# Patient Record
Sex: Female | Born: 1965 | Race: White | Hispanic: No | Smoking: Current every day smoker
Health system: Southern US, Community
[De-identification: ages and names within clinical notes are randomized; demographics above are authoritative.]

## PROBLEM LIST (undated history)

## (undated) DIAGNOSIS — F319 Bipolar disorder, unspecified: Secondary | ICD-10-CM

## (undated) DIAGNOSIS — F4310 Post-traumatic stress disorder, unspecified: Secondary | ICD-10-CM

## (undated) HISTORY — PX: TUBAL LIGATION: SHX77

## (undated) HISTORY — DX: Post-traumatic stress disorder, unspecified: F43.10

---
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
## 2009-10-05 ENCOUNTER — Emergency Department (HOSPITAL_COMMUNITY): Admission: EM | Admit: 2009-10-05 | Discharge: 2009-10-05 | Payer: Self-pay | Admitting: Emergency Medicine

## 2013-04-13 ENCOUNTER — Encounter (HOSPITAL_COMMUNITY): Payer: Self-pay | Admitting: Emergency Medicine

## 2013-04-13 ENCOUNTER — Emergency Department (INDEPENDENT_AMBULATORY_CARE_PROVIDER_SITE_OTHER)
Admission: EM | Admit: 2013-04-13 | Discharge: 2013-04-13 | Disposition: A | Discharge status: Home / Self Care | Payer: Self-pay | Source: Home / Self Care | Attending: Emergency Medicine | Admitting: Emergency Medicine

## 2013-04-13 DIAGNOSIS — H00019 Hordeolum externum unspecified eye, unspecified eyelid: Secondary | ICD-10-CM

## 2013-04-13 DIAGNOSIS — H00016 Hordeolum externum left eye, unspecified eyelid: Secondary | ICD-10-CM

## 2013-04-13 HISTORY — DX: Bipolar disorder, unspecified: F31.9

## 2013-04-13 MED ORDER — SULFAMETHOXAZOLE-TMP DS 800-160 MG PO TABS: 2.0000 | ORAL_TABLET | Freq: Two times a day (BID) | ORAL | Status: DC

## 2013-04-13 MED ORDER — CEPHALEXIN 500 MG PO CAPS: 500.0000 mg | ORAL_CAPSULE | Freq: Three times a day (TID) | ORAL | Status: DC

## 2013-04-13 MED ORDER — ERYTHROMYCIN 5 MG/GM OP OINT: TOPICAL_OINTMENT | OPHTHALMIC | Status: DC

## 2013-04-13 MED ORDER — TETRACAINE HCL 0.5 % OP SOLN
OPHTHALMIC | Status: AC
  Filled 2013-04-13: qty 2

## 2013-04-13 NOTE — ED Notes (Signed)
Left eye pain, symptoms onset Friday.  Reports bump in corner of eye.  Reports using warm and cold compresses.  Used family member vigamox x 2 and bacitracin opthalmic ointment.  Saturday reported popping bump and " core came out" a second bump developed and too painful to touch.  Has continued to squeeze pus out of first bump.

## 2013-04-13 NOTE — ED Provider Notes (Signed)
Chief Complaint:   Chief Complaint  Patient presents with  . Eye Pain    History of Present Illness:   Tammy Pierce is a 47 year old female who has had a six-day history of swelling and erythema on her left lower eyelid margin. Her aunt popped a couple of the small pimple-like lesions that had come up and drained some pus out. She's been treating this herself with some Vigamox and bacitracin ointment without improvement. It seems to be getting worse. It's not painful right now and hurts minimally to touch. She denies any redness of the eye, purulent drainage, or changes in her vision. She has no photophobia. No pain on eye movement. No swelling of the periorbital tissues. The right eye is normal. She's never had anything like this before.   Review of Systems:  Other than noted above, the patient denies any of the following symptoms: Systemic:  No fever, chills, sweats, fatigue, or weight loss. Eye:  No redness, eye pain, photophobia, discharge, blurred vision, or diplopia. ENT:  No nasal congestion, rhinorrhea, or sore throat. Lymphatic:  No adenopathy. Skin:  No rash or pruritis.  PMFSH:  Past medical history, family history, social history, meds, and allergies were reviewed.  She has no medication allergies. She takes Tegretol and trazodone for bipolar disorder. She is a cigarette smoker.  Physical Exam:   Vital signs:  BP 142/86  Pulse 78  Temp(Src) 97 F (36.1 C) (Oral)  Resp 16  SpO2 96% General:  Alert and in no distress. Eye:  She has 3 small pustules on her left lower eyelid margin. There is slight surrounding erythema but this does not extend to the periorbital tissues. The upper lid is normal. Conjunctival sac is normal without any discharge. There is no erythema of the conjunctiva or sclera. Anterior chambers normal, cornea is intact, fundi benign, PERRLA, full EOMs without any pain. ENT:  TMs and canals clear.  Nasal mucosa normal.  No intra-oral lesions, mucous membranes moist,  pharynx clear. Neck:  No adenopathy tenderness or mass. Skin:  Clear, warm and dry.  Assessment:  The encounter diagnosis was Hordeolum externum, left.  Plan:   1.  The following meds were prescribed:   Discharge Medication List as of 04/13/2013 10:42 AM    START taking these medications   Details  cephALEXin (KEFLEX) 500 MG capsule Take 1 capsule (500 mg total) by mouth 3 (three) times daily., Starting 04/13/2013, Until Discontinued, Normal    erythromycin ophthalmic ointment Place a 1/2 inch ribbon of ointment into the lower eyelid., Normal    sulfamethoxazole-trimethoprim (BACTRIM DS) 800-160 MG per tablet Take 2 tablets by mouth 2 (two) times daily., Starting 04/13/2013, Until Discontinued, Normal       2.  The patient was instructed in symptomatic care and handouts were given. 3.  The patient was told to return if becoming worse in any way, if no better in 3 or 4 days, and given some red flag symptoms such as increasing swelling or pain or changes in her vision that would indicate earlier return. She was given the name of Dr. Wayna Chalet to followup with if that should get worse or fail to improve.     Reuben Likes, MD 04/13/13 [phone]

## 2013-07-08 DIAGNOSIS — E7800 Pure hypercholesterolemia, unspecified: Secondary | ICD-10-CM | POA: Insufficient documentation

## 2015-08-27 ENCOUNTER — Emergency Department (HOSPITAL_COMMUNITY)
Admission: EM | Admit: 2015-08-27 | Discharge: 2015-08-28 | Disposition: A | Discharge status: Home / Self Care | Payer: Self-pay | Attending: Emergency Medicine | Admitting: Emergency Medicine

## 2015-08-27 ENCOUNTER — Encounter (HOSPITAL_COMMUNITY): Payer: Self-pay | Admitting: Emergency Medicine

## 2015-08-27 DIAGNOSIS — Z720 Tobacco use: Secondary | ICD-10-CM | POA: Insufficient documentation

## 2015-08-27 DIAGNOSIS — R200 Anesthesia of skin: Secondary | ICD-10-CM | POA: Insufficient documentation

## 2015-08-27 DIAGNOSIS — R2243 Localized swelling, mass and lump, lower limb, bilateral: Secondary | ICD-10-CM | POA: Insufficient documentation

## 2015-08-27 DIAGNOSIS — R202 Paresthesia of skin: Secondary | ICD-10-CM | POA: Insufficient documentation

## 2015-08-27 DIAGNOSIS — Z792 Long term (current) use of antibiotics: Secondary | ICD-10-CM | POA: Insufficient documentation

## 2015-08-27 DIAGNOSIS — F319 Bipolar disorder, unspecified: Secondary | ICD-10-CM | POA: Insufficient documentation

## 2015-08-27 DIAGNOSIS — Z79899 Other long term (current) drug therapy: Secondary | ICD-10-CM | POA: Insufficient documentation

## 2015-08-27 NOTE — ED Notes (Signed)
Pt. reports bilateral lower legs and feet  swelling onset this week , denies injury , pt. stated she started a new job that requires standing for several hours , pt. added right hand numbness onset this evening . Equal grips with no arm drift.

## 2015-08-28 ENCOUNTER — Emergency Department (HOSPITAL_COMMUNITY): Payer: Self-pay

## 2015-08-28 DIAGNOSIS — R202 Paresthesia of skin: Secondary | ICD-10-CM

## 2015-08-28 LAB — CBC
HEMATOCRIT: 40.5 % (ref 36.0–46.0)
HEMOGLOBIN: 13.5 g/dL (ref 12.0–15.0)
MCH: 31.9 pg (ref 26.0–34.0)
MCHC: 33.3 g/dL (ref 30.0–36.0)
MCV: 95.7 fL (ref 78.0–100.0)
Platelets: 164 10*3/uL (ref 150–400)
RBC: 4.23 MIL/uL (ref 3.87–5.11)
RDW: 14 % (ref 11.5–15.5)
WBC: 6.4 10*3/uL (ref 4.0–10.5)

## 2015-08-28 LAB — PROTIME-INR
INR: 0.93 (ref 0.00–1.49)
Prothrombin Time: 12.7 seconds (ref 11.6–15.2)

## 2015-08-28 LAB — DIFFERENTIAL
BASOS ABS: 0 10*3/uL (ref 0.0–0.1)
Basophils Relative: 0 % (ref 0–1)
Eosinophils Absolute: 0.2 10*3/uL (ref 0.0–0.7)
Eosinophils Relative: 3 % (ref 0–5)
LYMPHS ABS: 3.2 10*3/uL (ref 0.7–4.0)
LYMPHS PCT: 49 % — AB (ref 12–46)
MONOS PCT: 10 % (ref 3–12)
Monocytes Absolute: 0.6 10*3/uL (ref 0.1–1.0)
NEUTROS ABS: 2.4 10*3/uL (ref 1.7–7.7)
Neutrophils Relative %: 38 % — ABNORMAL LOW (ref 43–77)

## 2015-08-28 LAB — RAPID URINE DRUG SCREEN, HOSP PERFORMED
Amphetamines: NOT DETECTED
BARBITURATES: NOT DETECTED
BENZODIAZEPINES: NOT DETECTED
Cocaine: NOT DETECTED
Opiates: NOT DETECTED
Tetrahydrocannabinol: NOT DETECTED

## 2015-08-28 LAB — APTT: APTT: 24 s (ref 24–37)

## 2015-08-28 LAB — I-STAT TROPONIN, ED: TROPONIN I, POC: 0 ng/mL (ref 0.00–0.08)

## 2015-08-28 LAB — URINALYSIS, ROUTINE W REFLEX MICROSCOPIC
Bilirubin Urine: NEGATIVE
GLUCOSE, UA: NEGATIVE mg/dL
HGB URINE DIPSTICK: NEGATIVE
KETONES UR: NEGATIVE mg/dL
LEUKOCYTES UA: NEGATIVE
Nitrite: NEGATIVE
PROTEIN: NEGATIVE mg/dL
Specific Gravity, Urine: 1.022 (ref 1.005–1.030)
Urobilinogen, UA: 0.2 mg/dL (ref 0.0–1.0)
pH: 7 (ref 5.0–8.0)

## 2015-08-28 LAB — I-STAT CHEM 8, ED
BUN: 10 mg/dL (ref 6–20)
CALCIUM ION: 1.16 mmol/L (ref 1.12–1.23)
CHLORIDE: 108 mmol/L (ref 101–111)
CREATININE: 0.9 mg/dL (ref 0.44–1.00)
GLUCOSE: 108 mg/dL — AB (ref 65–99)
HCT: 42 % (ref 36.0–46.0)
Hemoglobin: 14.3 g/dL (ref 12.0–15.0)
POTASSIUM: 5.3 mmol/L — AB (ref 3.5–5.1)
Sodium: 141 mmol/L (ref 135–145)
TCO2: 23 mmol/L (ref 0–100)

## 2015-08-28 LAB — COMPREHENSIVE METABOLIC PANEL
ALK PHOS: 74 U/L (ref 38–126)
ALT: 53 U/L (ref 14–54)
AST: 43 U/L — AB (ref 15–41)
Albumin: 3.7 g/dL (ref 3.5–5.0)
Anion gap: 8 (ref 5–15)
BILIRUBIN TOTAL: 0.7 mg/dL (ref 0.3–1.2)
BUN: 8 mg/dL (ref 6–20)
CALCIUM: 8.7 mg/dL — AB (ref 8.9–10.3)
CHLORIDE: 109 mmol/L (ref 101–111)
CO2: 22 mmol/L (ref 22–32)
CREATININE: 0.88 mg/dL (ref 0.44–1.00)
GFR calc Af Amer: >60 mL/min (ref >60)
Glucose, Bld: 111 mg/dL — ABNORMAL HIGH (ref 65–99)
Potassium: 4.2 mmol/L (ref 3.5–5.1)
Sodium: 139 mmol/L (ref 135–145)
TOTAL PROTEIN: 5.7 g/dL — AB (ref 6.5–8.1)

## 2015-08-28 LAB — ETHANOL: Alcohol, Ethyl (B): <5 mg/dL (ref <5)

## 2015-08-28 LAB — CK: CK TOTAL: 107 U/L (ref 38–234)

## 2015-08-28 NOTE — ED Notes (Signed)
Pt ambulated to restroom with this RN.   

## 2015-08-28 NOTE — ED Notes (Signed)
Patient transported to CT 

## 2015-08-28 NOTE — Consult Note (Signed)
Consult Reason for Consult:right arm paresthesias Referring Physician: Dr Mora Bellman [facility]  CC: RUE paresthesias  HPI: Tammy Pierce is an 49 y.o. female with history of bipolar affective disorder presenting with transient RUE paresthesias. Notes watching TV when she developed tingling in her right hand that radiated up to her shoulder. No clear dermatomal distribution. Symptoms slowly resolved and was left with pins and needles sensation in her right palm. This completely resolved after 45 minutes. Denies any weakness. Does note a cramping sensation in her right biceps. No LUE or LE symptoms. No speech or visual deficits. Currently asymptomatic besides for right bicep tightness. No prior TIA or CVA. No HTN, DM or HLD. Does not take a daily antiplatelet.   Of note she started a new job last week which involves a lot of standing and using her arms excessively.   Past Medical History  Diagnosis Date  . Bipolar affective     Past Surgical History  Procedure Laterality Date  . Tubal ligation      No family history on file.  Social History:  reports that she has been smoking.  She does not have any smokeless tobacco history on file. She reports that she drinks alcohol. She reports that she does not use illicit drugs.  No Known Allergies  Medications: I have reviewed the patient's current medications.  ROS: Out of a complete 14 system review, the patient complains of only the following symptoms, and all other reviewed systems are negative. +paresthesias  Physical Examination: Filed Vitals:   08/28/15 0100  BP: 148/81  Pulse: 78  Temp:   Resp: 19   Physical Exam  Constitutional: He appears well-developed and well-nourished.  Psych: Affect appropriate to situation Eyes: No scleral injection HENT: No OP obstrucion Head: Normocephalic.  Cardiovascular: Normal rate and regular rhythm.  Respiratory: Effort normal and breath sounds normal.  GI: Soft. Bowel sounds are normal. No  distension. There is no tenderness.  Skin: WDI  Neurologic Examination Mental Status: Alert, oriented, thought content appropriate.  Speech fluent without evidence of aphasia.  Able to follow 3 step commands without difficulty. Cranial Nerves: II: funduscopic exam wnl bilaterally, visual fields grossly normal, pupils equal, round, reactive to light and accommodation III,IV, VI: ptosis not present, extra-ocular motions intact bilaterally V,VII: smile symmetric, facial light touch sensation normal bilaterally VIII: hearing normal bilaterally IX,X: gag reflex present XI: trapezius strength/neck flexion strength normal bilaterally XII: tongue strength normal  Motor: Right : Upper extremity    Left:     Upper extremity 5/5 deltoid       5/5 deltoid 5/5 biceps      5/5 biceps  5/5 triceps      5/5 triceps 5/5 hand grip      5/5 hand grip  Lower extremity     Lower extremity 5/5 hip flexor      5/5 hip flexor 5/5 quadricep      5/5 quadriceps  5/5 hamstrings     5/5 hamstrings 5/5 plantar flexion       5/5 plantar flexion 5/5 plantar extension     5/5 plantar extension Tone and bulk:normal tone throughout; no atrophy noted Sensory: Pinprick and light touch intact throughout, bilaterally Deep Tendon Reflexes: 2+ and symmetric throughout Plantars: Right: downgoing   Left: downgoing Cerebellar: normal finger-to-nose, normal rapid alternating movements and normal heel-to-shin test Gait: deferred  Laboratory Studies:   Basic Metabolic Panel:  Recent Labs Lab 08/28/15 0113  NA 141  K 5.3*  CL 108  GLUCOSE 108*  BUN 10  CREATININE 0.90    Liver Function Tests: No results for input(s): AST, ALT, ALKPHOS, BILITOT, PROT, ALBUMIN in the last 168 hours. No results for input(s): LIPASE, AMYLASE in the last 168 hours. No results for input(s): AMMONIA in the last 168 hours.  CBC:  Recent Labs Lab 08/28/15 0059 08/28/15 0113  WBC 6.4  --   NEUTROABS 2.4  --   HGB 13.5 14.3   HCT 40.5 42.0  MCV 95.7  --   PLT 164  --     Cardiac Enzymes: No results for input(s): CKTOTAL, CKMB, CKMBINDEX, TROPONINI in the last 168 hours.  BNP: Invalid input(s): POCBNP  CBG: No results for input(s): GLUCAP in the last 168 hours.  Microbiology: No results found for this or any previous visit.  Coagulation Studies: No results for input(s): LABPROT, INR in the last 72 hours.  Urinalysis: No results for input(s): COLORURINE, LABSPEC, PHURINE, GLUCOSEU, HGBUR, BILIRUBINUR, KETONESUR, PROTEINUR, UROBILINOGEN, NITRITE, LEUKOCYTESUR in the last 168 hours.  Invalid input(s): APPERANCEUR  Lipid Panel:  No results found for: CHOL, TRIG, HDL, CHOLHDL, VLDL, LDLCALC  HgbA1C: No results found for: HGBA1C  Urine Drug Screen:  No results found for: LABOPIA, COCAINSCRNUR, LABBENZ, AMPHETMU, THCU, LABBARB  Alcohol Level: No results for input(s): ETH in the last 168 hours.  Other results:  Imaging: No results found.   Assessment/Plan:  48y/o hx of bipolar presenting with transient paresthesias in her RUE radiating from shoulder down to hand. Currently asymptomatic with exception of mild cramping in right bicep. No stroke risk factors. Feel her presentation is most consistent with a possible cervical radiculopathy, especially in light of recently starting a new job with excessive use of her arms. Cannot rule out TIA though low risk based on history and lack of risk factors.  -CT head. If negative no further inpatient workup required at this time -suggest taking ASA  daily -outpatient follow up with PCP -immediately return to ED if symptoms return  Elspeth Cho, DO [facility] [phone]  If 7pm- 7am, please page neurology on call as listed in AMION. 08/28/2015, 1:19 AM

## 2015-08-28 NOTE — Discharge Instructions (Signed)
Paresthesia Tammy Pierce, your workup today was normal.  See neurology within 3 days for close follow up.  If symptoms worsen, come back to the ED immediately.  Thank you. Paresthesia is a burning or prickling feeling. This feeling can happen in any part of the body. It often happens in the hands, arms, legs, or feet. HOME CARE  Avoid drinking alcohol.  Try massage or needle therapy (acupuncture) to help with your problems.  Keep all doctor visits as told. GET HELP RIGHT AWAY IF:   You feel weak.  You have trouble walking or moving.  You have problems speaking or seeing.  You feel confused.  You cannot control when you poop (bowel movement) or pee (urinate).  You lose feeling (numbness) after an injury.  You pass out (faint).  Your burning or prickling feeling gets worse when you walk.  You have pain, cramps, or feel dizzy.  You have a rash. MAKE SURE YOU:   Understand these instructions.  Will watch your condition.  Will get help right away if you are not doing well or get worse. Document Released: 11/29/2008 Document Revised: 03/10/2012 Document Reviewed: 09/07/2011 [facility] Patient Information 2015 [place], [place]. This information is not intended to replace advice given to you by your health care provider. Make sure you discuss any questions you have with your health care provider.

## 2015-08-28 NOTE — ED Provider Notes (Signed)
CSN: [id number]     Arrival date & time 08/27/15  2337 History  This chart was scribed for Tomasita Crumble, MD by Freida Busman, ED Scribe. This patient was seen in room B19C/B19C and the patient's care was started 12:26 AM.    Chief Complaint  Patient presents with  . Leg Pain  . Numbness    The history is provided by the patient. No language interpreter was used.     HPI Comments:  Tammy Pierce is a 49 y.o. female who presents to the Emergency Department complaining of  numbness and tightness in her right upper arm and  and right hand that started ~ 45 min ago. She reports associated tingling to her right palm, a knot behind her left calf and mild swelling to her BLE. She denies weakness in her BUE/BLE. Pt notes she recently started a new job where she is on her feet for long periods of time. She has used a heating pad for the knot in her LLE with mild relief. She denies weakness, speech changes, confusion, SOB and CP. Pt has a family history of CVA and TIA.  Past Medical History  Diagnosis Date  . Bipolar affective    Past Surgical History  Procedure Laterality Date  . Tubal ligation     No family history on file. Social History  Substance Use Topics  . Smoking status: Current Every Day Smoker  . Smokeless tobacco: None  . Alcohol Use: Yes   OB History    No data available     Review of Systems  A complete 10 system review of systems was obtained and all systems are negative except as noted in the HPI and PMH.    Allergies  Review of patient's allergies indicates no known allergies.  Home Medications   Prior to Admission medications   Medication Sig Start Date End Date Taking? Authorizing Provider  carbamazepine (CARBATROL) 100 MG 12 hr capsule Take 100 mg by mouth 2 (two) times daily.    Historical Provider, MD  cephALEXin (KEFLEX) 500 MG capsule Take 1 capsule (500 mg total) by mouth 3 (three) times daily. 04/13/13   Reuben Likes, MD  erythromycin ophthalmic  ointment Place a 1/2 inch ribbon of ointment into the lower eyelid. 04/13/13   Reuben Likes, MD  sulfamethoxazole-trimethoprim (BACTRIM DS) 800-160 MG per tablet Take 2 tablets by mouth 2 (two) times daily. 04/13/13   Reuben Likes, MD  TRAZODONE HCL PO Take by mouth.    Historical Provider, MD   BP 152/84 mmHg  Pulse 84  Temp(Src) 98.7 F (37.1 C) (Oral)  Resp 20  SpO2 98% Physical Exam  Constitutional: She is oriented to person, place, and time. She appears well-developed and well-nourished. No distress.  HENT:  Head: Normocephalic and atraumatic.  Nose: Nose normal.  Mouth/Throat: Oropharynx is clear and moist. No oropharyngeal exudate.  Eyes: Conjunctivae and EOM are normal. Pupils are equal, round, and reactive to light. No scleral icterus.  Neck: Normal range of motion. Neck supple. No JVD present. No tracheal deviation present. No thyromegaly present.  Cardiovascular: Normal rate, regular rhythm and normal heart sounds.  Exam reveals no gallop and no friction rub.   No murmur heard. Pulmonary/Chest: Effort normal and breath sounds normal. No respiratory distress. She has no wheezes. She exhibits no tenderness.  Abdominal: Soft. Bowel sounds are normal. She exhibits no distension and no mass. There is no tenderness. There is no rebound and no guarding.  Musculoskeletal: Normal  range of motion. She exhibits edema (BLE, equal). She exhibits no tenderness.  Lymphadenopathy:    She has no cervical adenopathy.  Neurological: She is alert and oriented to person, place, and time. No cranial nerve deficit. She exhibits normal muscle tone.  RUE decreased sensation  Skin: Skin is warm and dry. No rash noted. No erythema. No pallor.  Nursing note and vitals reviewed.   ED Course  Procedures   DIAGNOSTIC STUDIES:  Oxygen Saturation is 98% on RA, normal by my interpretation.    COORDINATION OF CARE:  12:32 AM Discussed treatment plan with pt at bedside and pt agreed to plan.  Labs  Review Labs Reviewed  DIFFERENTIAL - Abnormal; Notable for the following:    Neutrophils Relative % 38 (*)    Lymphocytes Relative 49 (*)    All other components within normal limits  COMPREHENSIVE METABOLIC PANEL - Abnormal; Notable for the following:    Glucose, Bld 111 (*)    Calcium 8.7 (*)    Total Protein 5.7 (*)    AST 43 (*)    All other components within normal limits  I-STAT CHEM 8, ED - Abnormal; Notable for the following:    Potassium 5.3 (*)    Glucose, Bld 108 (*)    All other components within normal limits  ETHANOL  PROTIME-INR  APTT  CBC  URINE RAPID DRUG SCREEN, HOSP PERFORMED  URINALYSIS, ROUTINE W REFLEX MICROSCOPIC (NOT AT [facility])  CK  I-STAT TROPOININ, ED    Imaging Review Ct Head Wo Contrast  08/28/2015   CLINICAL DATA:  Right upper extremity with numbness, right palm tingling. Concern for stroke.  EXAM: CT HEAD WITHOUT CONTRAST  TECHNIQUE: Contiguous axial images were obtained from the base of the skull through the vertex without intravenous contrast.  COMPARISON:  None.  FINDINGS: No intracranial hemorrhage, mass effect, or midline shift. No hydrocephalus. The basilar cisterns are patent. No evidence of territorial infarct. No intracranial fluid collection. Calvarium is intact. Included paranasal sinuses and mastoid air cells are well aerated.  IMPRESSION: No acute intracranial abnormality, no CT findings of acute ischemia.   Electronically Signed   By: Rubye Oaks M.D.   On: 08/28/2015 02:03   I have personally reviewed and evaluated these images and lab results as part of my medical decision-making.   EKG Interpretation   Date/Time:  Sunday August 28 2015 00:44:11 EDT Ventricular Rate:  81 PR Interval:  142 QRS Duration: 76 QT Interval:  368 QTC Calculation: 427 R Axis:   -7 Text Interpretation:  Sinus rhythm No old tracing to compare Confirmed by  Erroll Luna [phone]) on 08/28/2015 1:11:19 AM      MDM   Final diagnoses:  None    Patient presents emergency department for decreased sensation of her right upper extremity.  Patient is hypertensive in the room. Patient could have symptoms of CVA. I will consult with neurology for evaluation in the emergency department. Stroke workup has been ordered.  Stroke workup including CT scan of head is negative. Patient was evaluated by neurology, Dr. Luisa Hart who agrees this history is not completely consistent with TIA. No recurrence in symptoms today. Symptoms are likely related to her recent new job and overuse of her arms and legs. Patient appears well and in no acute distress. Her vital signs within her normal limits and she is safe for discharge.   I personally performed the services described in this documentation, which was scribed in my presence. The recorded information  has been reviewed and is accurate.   Tomasita Crumble, MD 08/28/15 9604

## 2015-08-28 NOTE — ED Notes (Signed)
Dr. Sumner at bedside 

## 2015-10-04 ENCOUNTER — Emergency Department (INDEPENDENT_AMBULATORY_CARE_PROVIDER_SITE_OTHER)
Admission: EM | Admit: 2015-10-04 | Discharge: 2015-10-04 | Disposition: A | Discharge status: Home / Self Care | Payer: Self-pay | Source: Home / Self Care | Attending: Emergency Medicine | Admitting: Emergency Medicine

## 2015-10-04 ENCOUNTER — Encounter (HOSPITAL_COMMUNITY): Payer: Self-pay | Admitting: Emergency Medicine

## 2015-10-04 DIAGNOSIS — K529 Noninfective gastroenteritis and colitis, unspecified: Secondary | ICD-10-CM

## 2015-10-04 MED ORDER — ONDANSETRON HCL 4 MG PO TABS: 4.0000 mg | ORAL_TABLET | Freq: Four times a day (QID) | ORAL | Status: DC

## 2015-10-04 NOTE — ED Provider Notes (Signed)
CSN: [id number]     Arrival date & time 10/04/15  1949 History   First MD Initiated Contact with Patient 10/04/15 2011     Chief Complaint  Patient presents with  . Abdominal Pain   (Consider location/radiation/quality/duration/timing/severity/associated sxs/prior Treatment) HPI Comments: 49 year old female has had nausea vomiting diarrhea for 3 days. It has been associated with abdominal cramping across the lower abdomen. Today she said primarily "dry heaves". She has attempted to eat foods including chicken and dumplings but she vomited those yesterday. Today she has had watery stools to numerous to count.  Patient is a 49 y.o. female presenting with abdominal pain. The history is provided by the patient.  Abdominal Pain Associated symptoms: diarrhea, nausea and vomiting   Associated symptoms: no fatigue and no fever     Past Medical History  Diagnosis Date  . Bipolar affective [facility])    Past Surgical History  Procedure Laterality Date  . Tubal ligation     No family history on file. Social History  Substance Use Topics  . Smoking status: Current Every Day Smoker  . Smokeless tobacco: None  . Alcohol Use: Yes   OB History    No data available     Review of Systems  Constitutional: Positive for activity change. Negative for fever and fatigue.  HENT: Negative.   Respiratory: Negative.   Cardiovascular: Negative.   Gastrointestinal: Positive for nausea, vomiting, abdominal pain and diarrhea.  Genitourinary: Negative.   Skin: Negative.   Neurological: Negative.     Allergies  Other  Home Medications   Prior to Admission medications   Medication Sig Start Date End Date Taking? Authorizing Provider  esomeprazole (NEXIUM) 40 MG capsule Take 40 mg by mouth daily at 12 noon.    Historical Provider, MD  Multiple Vitamin (MULTIVITAMIN WITH MINERALS) TABS tablet Take 1 tablet by mouth daily.    Historical Provider, MD  ondansetron (ZOFRAN) 4 MG tablet Take 1 tablet (4 mg  total) by mouth every 6 (six) hours. 10/04/15   Hayden Rasmussen, NP   Meds Ordered and Administered this Visit  Medications - No data to display  BP 147/87 mmHg  Pulse 87  Temp(Src) 98.4 F (36.9 C) (Oral)  Resp 20  SpO2 96% No data found.   Physical Exam  Constitutional: She is oriented to person, place, and time. She appears well-developed and well-nourished. No distress.  HENT:  Mouth/Throat: Oropharynx is clear and moist.  Eyes: Conjunctivae and EOM are normal.  Neck: Normal range of motion. Neck supple.  Cardiovascular: Normal rate, regular rhythm and normal heart sounds.   Pulmonary/Chest: Effort normal. No respiratory distress.  Abdominal: Soft. Bowel sounds are normal. She exhibits no distension. There is tenderness. There is no rebound.  Minor tenderness across the lower abdomen.  Musculoskeletal: She exhibits no edema.  Lymphadenopathy:    She has no cervical adenopathy.  Neurological: She is alert and oriented to person, place, and time.  Skin: Skin is warm and dry.  Nursing note and vitals reviewed.   ED Course  Procedures (including critical care time)  Labs Review Labs Reviewed - No data to display  Imaging Review No results found.   Visual Acuity Review  Right Eye Distance:   Left Eye Distance:   Bilateral Distance:    Right Eye Near:   Left Eye Near:    Bilateral Near:         MDM   1. Gastroenteritis    Patient is not showing evidence of dehydration.  The oropharynx is moist. She is not tachycardic. She has a normal blood pressure, good skin turgor. She is currently not having any vomiting or diarrhea. She agrees with the plan of trying Zofran and drinking P light and no food for 24 hours. She may slowly advance diet as tolerated. May take one Imodium right ear to slow the diarrhea now. In 6 hours if she has 4 stools she may take 1 more. I do not take as often as instructed on the box. If vomiting persist or develop a fever and unable to hold  liquids down and go to the emergency department promptly.    Hayden Rasmussen, NP 10/04/15 2047

## 2015-10-04 NOTE — ED Notes (Signed)
C/o abd pain onset 4 days associated with n/v/d Denies fevers, chills Parents also have similar sx A&O x4... No acute distress.

## 2021-11-07 ENCOUNTER — Ambulatory Visit (HOSPITAL_COMMUNITY)
Admission: EM | Admit: 2021-11-07 | Discharge: 2021-11-07 | Disposition: A | Discharge status: Home / Self Care | Payer: Self-pay | Attending: Emergency Medicine | Admitting: Emergency Medicine

## 2021-11-07 ENCOUNTER — Encounter (HOSPITAL_COMMUNITY): Payer: Self-pay | Admitting: Emergency Medicine

## 2021-11-07 ENCOUNTER — Other Ambulatory Visit: Payer: Self-pay

## 2021-11-07 DIAGNOSIS — J029 Acute pharyngitis, unspecified: Secondary | ICD-10-CM | POA: Insufficient documentation

## 2021-11-07 LAB — POCT RAPID STREP A, ED / UC: Streptococcus, Group A Screen (Direct): NEGATIVE

## 2021-11-07 MED ORDER — PREDNISONE 20 MG PO TABS: 40.0000 mg | ORAL_TABLET | Freq: Every day | ORAL | 0 refills | Status: DC

## 2021-11-07 MED ORDER — LIDOCAINE VISCOUS HCL 2 % MT SOLN: 15.0000 mL | OROMUCOSAL | 0 refills | Status: DC | PRN

## 2021-11-07 MED ORDER — IBUPROFEN 800 MG PO TABS: 800.0000 mg | ORAL_TABLET | Freq: Three times a day (TID) | ORAL | 0 refills | Status: DC

## 2021-11-07 NOTE — ED Provider Notes (Signed)
[facility]    CSN: [id number] Arrival date & time: 11/07/21  2952      History   Chief Complaint Chief Complaint  Patient presents with  . Sore Throat    HPI Tammy Pierce is a 55 y.o. female.   Patient presents with sore throat for 2 weeks and swollen lymph nodes for 1 week.  Endorses it has become very painful to swallow and the swelling gives a sensation of difficulty breathing.  Poor appetite but tolerating fluids.  Denies fever, chills, body aches, cough, wheezing, abdominal pain, nausea, vomiting, diarrhea, headaches, ear pain.  No pertinent medical history.  Has attempted use of over-the-counter Tylenol with no relief.  No known sick contacts.  Past Medical History:  Diagnosis Date  . Bipolar affective (HCC)     There are no problems to display for this patient.   Past Surgical History:  Procedure Laterality Date  . TUBAL LIGATION      OB History   No obstetric history on file.      Home Medications    Prior to Admission medications   Medication Sig Start Date End Date Taking? Authorizing Provider  esomeprazole (NEXIUM) 40 MG capsule Take 40 mg by mouth daily at 12 noon.    [provider]  Multiple Vitamin (MULTIVITAMIN WITH MINERALS) TABS tablet Take 1 tablet by mouth daily.    [provider]  ondansetron (ZOFRAN) 4 MG tablet Take 1 tablet (4 mg total) by mouth every 6 (six) hours. 10/04/15   Hayden Rasmussen, NP    Family History History reviewed. No pertinent family history.  Social History Social History   Tobacco Use  . Smoking status: Every Day  Substance Use Topics  . Alcohol use: Yes  . Drug use: No     Allergies   Other   Review of Systems Review of Systems  Constitutional: Negative.   HENT:  Positive for ear pain and sore throat. Negative for congestion, dental problem, drooling, ear discharge, facial swelling, hearing loss, mouth sores, nosebleeds, postnasal drip, rhinorrhea, sinus pressure, sinus pain,  sneezing, tinnitus, trouble swallowing and voice change.   Respiratory: Negative.    Cardiovascular: Negative.   Skin: Negative.   Neurological: Negative.     Physical Exam Triage Vital Signs ED Triage Vitals  Enc Vitals Group     BP 11/07/21 0825 (!) 194/116     Pulse Rate 11/07/21 0825 80     Resp 11/07/21 0825 19     Temp 11/07/21 0825 98.9 F (37.2 C)     Temp src --      SpO2 11/07/21 0825 97 %     Weight --      Height --      Head Circumference --      Peak Flow --      Pain Score 11/07/21 0827 7     Pain Loc --      Pain Edu? --      Excl. in GC? --    No data found.  Updated Vital Signs BP (!) 175/101 (BP Location: Right Arm)   Pulse 80   Temp 98.9 F (37.2 C)   Resp 19   SpO2 97%   Visual Acuity Right Eye Distance:   Left Eye Distance:   Bilateral Distance:    Right Eye Near:   Left Eye Near:    Bilateral Near:     Physical Exam HENT:     Head: Normocephalic.  Right Ear: Tympanic membrane and ear canal normal.     Left Ear: Tympanic membrane and ear canal normal.     Mouth/Throat:     Mouth: Mucous membranes are moist.     Pharynx: Posterior oropharyngeal erythema present.     Tonsils: No tonsillar exudate or tonsillar abscesses. 3+ on the right. 3+ on the left.  Cardiovascular:     Rate and Rhythm: Normal rate and regular rhythm.     Heart sounds: Normal heart sounds.  Pulmonary:     Effort: Pulmonary effort is normal.     Breath sounds: Normal breath sounds.  Abdominal:     General: Bowel sounds are normal.     Palpations: Abdomen is soft.  Musculoskeletal:     Cervical back: Normal range of motion.  Lymphadenopathy:     Cervical: Cervical adenopathy present.  Skin:    General: Skin is warm and dry.  Neurological:     General: No focal deficit present.     Mental Status: She is alert and oriented to person, place, and time.  Psychiatric:        Mood and Affect: Mood normal.        Behavior: Behavior normal.     UC  Treatments / Results  Labs (all labs ordered are listed, but only abnormal results are displayed) Labs Reviewed  POCT RAPID STREP A, ED / UC    EKG   Radiology No results found.  Procedures Procedures (including critical care time)  Medications Ordered in UC Medications - No data to display  Initial Impression / Assessment and Plan / UC Course  I have reviewed the triage vital signs and the nursing notes.  Pertinent labs & imaging results that were available during my care of the patient were reviewed by me and considered in my medical decision making (see chart for details).  Sore throat  1.  Rapid strep test negative, sent for culture, discussed findings with patient, etiology of symptoms most likely viral, discussed this with patient 2.  Ibuprofen 800 mg 3 times daily as needed 3.  Viscous lidocaine 2% 15 mils every 4 hours as needed 4.  Prednisone 40 mg daily for 5 days 5.  Recommended salt water gargles, warm liquids and throat lozenges in addition 6.  May follow-up with urgent care as needed Final Clinical Impressions(s) / UC Diagnoses   Final diagnoses:  None   Discharge Instructions   None    ED Prescriptions   None    PDMP not reviewed this encounter.   Valinda Hoar, [place] 11/07/21 [phone]

## 2021-11-07 NOTE — ED Triage Notes (Signed)
Pt is present today with a sore throat and a lump on the right side of her neck. Pt states the sore throat started a few weeks ago and she notice the lump one week ago

## 2021-11-07 NOTE — Discharge Instructions (Addendum)
Your rapid strep test today is negative, it has been sent to the lab to determine if it will grow bacteria, if this occurs then you will be notified and antibiotics sent and  Your sore throat is most likely related to a virus and we will manage this conservatively through the use of steroids to help lessen the swelling of your lymph nodes  Take prednisone every morning for the next 5 days with food  You may gargle lidocaine solution every 4 hours as needed to give temporary relief  Can attempt use of salt water gargles, warm liquids and throat lozenges in addition  Can use 800 mg of ibuprofen every 8  hours to help with swelling as well after completion of steroid

## 2021-11-09 LAB — CULTURE, GROUP A STREP (THRC)

## 2021-11-14 ENCOUNTER — Emergency Department (HOSPITAL_COMMUNITY)
Admission: EM | Admit: 2021-11-14 | Discharge: 2021-11-15 | Disposition: A | Discharge status: Home / Self Care | Payer: Self-pay | Attending: Emergency Medicine | Admitting: Emergency Medicine

## 2021-11-14 ENCOUNTER — Other Ambulatory Visit: Payer: Self-pay

## 2021-11-14 ENCOUNTER — Emergency Department (HOSPITAL_COMMUNITY): Payer: Self-pay

## 2021-11-14 DIAGNOSIS — U071 COVID-19: Secondary | ICD-10-CM | POA: Insufficient documentation

## 2021-11-14 DIAGNOSIS — F17200 Nicotine dependence, unspecified, uncomplicated: Secondary | ICD-10-CM | POA: Insufficient documentation

## 2021-11-14 DIAGNOSIS — R000 Tachycardia, unspecified: Secondary | ICD-10-CM | POA: Insufficient documentation

## 2021-11-14 MED ORDER — IPRATROPIUM-ALBUTEROL 0.5-2.5 (3) MG/3ML IN SOLN
3.0000 mL | Freq: Once | RESPIRATORY_TRACT | Status: AC
  Administered 2021-11-14: 3 mL via RESPIRATORY_TRACT
  Filled 2021-11-14: qty 3

## 2021-11-14 NOTE — ED Triage Notes (Addendum)
Pt has had increased cough x 1 week.  Audible wheezing.  Given duoneb and 125 solu medrol.  20 G IV in LFA

## 2021-11-14 NOTE — ED Provider Notes (Signed)
[facility] EMERGENCY DEPARTMENT Provider Note   CSN: [id number] Arrival date & time: 11/14/21  2304     History Chief Complaint  Patient presents with   Cough    Tammy Pierce is a 55 y.o. female.  55 year old female without significant past medical history who presents to the emergency department today with multiple symptoms.  Patient states that she had a cough for about a week.  She is also wheezing during that time she has some swelling in her neck.  She went to see another provider when they gave her albuterol seem to get better.  Tonight at around 2200 things got significantly worse and she called EMS.  She states that her breathing was a lot worse and her wheezing was a lot worse.  She does smoke but is never been diagnosed with COPD or asthma.  She does have a history of bronchitis in the past.  No fevers.  No other recent illnesses.   Cough     Past Medical History:  Diagnosis Date   Bipolar affective (HCC)     There are no problems to display for this patient.   Past Surgical History:  Procedure Laterality Date   TUBAL LIGATION       OB History   No obstetric history on file.     No family history on file.  Social History   Tobacco Use   Smoking status: Every Day  Substance Use Topics   Alcohol use: Yes   Drug use: No    Home Medications Prior to Admission medications   Medication Sig Start Date End Date Taking? Authorizing Provider  albuterol (VENTOLIN HFA) 108 (90 Base) MCG/ACT inhaler Inhale 2 puffs into the lungs every 6 (six) hours. 11/15/21  Yes Taz Vanness, Barbara Cower, MD  nirmatrelvir/ritonavir EUA (PAXLOVID) 20 x 150 MG & 10 x 100MG  TABS Take 3 tablets by mouth 2 (two) times daily for 5 days. Patient GFR is >60. Take nirmatrelvir (150 mg) two tablets twice daily for 5 days and ritonavir (100 mg) one tablet twice daily for 5 days. 11/15/21 11/20/21 Yes Janann Boeve, 11/22/21, MD  predniSONE (DELTASONE) 20 MG tablet 2 tabs po daily x 4 days  11/15/21  Yes Tymon Nemetz, 11/17/21, MD  esomeprazole (NEXIUM) 40 MG capsule Take 40 mg by mouth daily at 12 noon.    [provider]  ibuprofen (ADVIL) 800 MG tablet Take 1 tablet (800 mg total) by mouth 3 (three) times daily. 11/07/21   White, 13/8/22, NP  lidocaine (XYLOCAINE) 2 % solution Use as directed 15 mLs in the mouth or throat as needed for mouth pain. 11/07/21   13/8/22, NP  Multiple Vitamin (MULTIVITAMIN WITH MINERALS) TABS tablet Take 1 tablet by mouth daily.    [provider]  ondansetron (ZOFRAN) 4 MG tablet Take 1 tablet (4 mg total) by mouth every 6 (six) hours. 10/04/15   12/04/15, NP    Allergies    Other  Review of Systems   Review of Systems  Respiratory:  Positive for cough.   All other systems reviewed and are negative.  Physical Exam Updated Vital Signs BP 133/69   Pulse 99   Temp 98.2 F (36.8 C) (Oral)   Resp (!) 24   SpO2 97%   Physical Exam Vitals and nursing note reviewed.  Constitutional:      Appearance: She is well-developed.  HENT:     Head: Normocephalic and atraumatic.     Nose: Congestion and rhinorrhea  present.     Mouth/Throat:     Mouth: Mucous membranes are dry.  Eyes:     Pupils: Pupils are equal, round, and reactive to light.  Cardiovascular:     Rate and Rhythm: Regular rhythm. Tachycardia present.  Pulmonary:     Effort: No respiratory distress.     Breath sounds: No stridor. Wheezing present.  Abdominal:     General: Abdomen is flat. There is no distension.  Musculoskeletal:        General: No swelling. Normal range of motion.     Cervical back: Normal range of motion.  Skin:    General: Skin is warm and dry.  Neurological:     General: No focal deficit present.     Mental Status: She is alert.    ED Results / Procedures / Treatments   Labs (all labs ordered are listed, but only abnormal results are displayed) Labs Reviewed  RESP PANEL BY RT-PCR (FLU A&B, COVID) ARPGX2 - Abnormal; Notable  for the following components:      Result Value   SARS Coronavirus 2 by RT PCR POSITIVE (*)    All other components within normal limits  CBC WITH DIFFERENTIAL/PLATELET - Abnormal; Notable for the following components:   WBC 11.5 (*)    Lymphs Abs 4.7 (*)    Monocytes Absolute 1.2 (*)    All other components within normal limits  COMPREHENSIVE METABOLIC PANEL - Abnormal; Notable for the following components:   Glucose, Bld 105 (*)    Total Protein 5.9 (*)    All other components within normal limits    EKG None  Radiology DG Chest 2 View  Result Date: 11/15/2021 CLINICAL DATA:  Shortness of breath and increased cough over the past week EXAM: CHEST - 2 VIEW COMPARISON:  None. FINDINGS: Cardiac shadow is within normal limits. S-shaped scoliosis of the thoracolumbar spine is noted. No acute infiltrate or effusion is noted. No other focal abnormality is seen. IMPRESSION: No active cardiopulmonary disease. Electronically Signed   By: Alcide Clever M.D.   On: 11/15/2021 00:15    Procedures Procedures   Medications Ordered in ED Medications  aerochamber plus with mask device 1 each (has no administration in time range)  ipratropium-albuterol (DUONEB) 0.5-2.5 (3) MG/3ML nebulizer solution 3 mL (3 mLs Nebulization Given 11/14/21 2323)  albuterol (PROVENTIL) (2.5 MG/3ML) 0.083% nebulizer solution (5 mg/hr Nebulization Given 11/15/21 0026)  albuterol (VENTOLIN HFA) 108 (90 Base) MCG/ACT inhaler 4 puff (4 puffs Inhalation Given 11/15/21 0307)  predniSONE (DELTASONE) tablet 60 mg (60 mg Oral Given 11/15/21 3875)    ED Course  I have reviewed the triage vital signs and the nursing notes.  Pertinent labs & imaging results that were available during my care of the patient were reviewed by me and considered in my medical decision making (see chart for details).    MDM Rules/Calculators/A&P                         Seems like bronchitis.  We will check for any concurrent illnesses.  Treat  with albuterol.  Is already had steroids.  Depend on improvement in how she is feeling may be able to discharge but may need admitted.  Reevaluated after her continuous nebulizer patient feels significantly better.  She states she is able to breathe through her nose and her mouth now.  She still does have a hoarse voice consistent with laryngitis but vital signs are stable.  We  will continue to observe.  COVID positive.  I discussed these findings with her she thinks she might got it from work.  She is interested in Paxlovid.  No obvious contraindications.  Prescription provided.  Repeat vitals show that she still a bit tachypneic but improved from before.  Has been observed for over 4 hours without any significant worsening of her symptoms and patient states that she feels much better.  Will discharge on albuterol, Paxlovid and a burst of steroids.  PCP follow-up.  Return here for worsening symptoms. Work note provided.   Final Clinical Impression(s) / ED Diagnoses Final diagnoses:  COVID    Rx / DC Orders ED Discharge Orders          Ordered    nirmatrelvir/ritonavir EUA (PAXLOVID) 20 x 150 MG & 10 x 100MG  TABS  2 times daily        11/15/21 0305    predniSONE (DELTASONE) 20 MG tablet        11/15/21 0305    albuterol (VENTOLIN HFA) 108 (90 Base) MCG/ACT inhaler  Every 6 hours        11/15/21 0305             Guillermina Shaft, 11/17/21, MD 11/15/21 11/17/21

## 2021-11-15 LAB — CBC WITH DIFFERENTIAL/PLATELET
Abs Immature Granulocytes: 0.06 10*3/uL (ref 0.00–0.07)
Basophils Absolute: 0 10*3/uL (ref 0.0–0.1)
Basophils Relative: 0 %
Eosinophils Absolute: 0.3 10*3/uL (ref 0.0–0.5)
Eosinophils Relative: 3 %
HCT: 41.4 % (ref 36.0–46.0)
Hemoglobin: 13.2 g/dL (ref 12.0–15.0)
Immature Granulocytes: 1 %
Lymphocytes Relative: 41 %
Lymphs Abs: 4.7 10*3/uL — ABNORMAL HIGH (ref 0.7–4.0)
MCH: 31.4 pg (ref 26.0–34.0)
MCHC: 31.9 g/dL (ref 30.0–36.0)
MCV: 98.6 fL (ref 80.0–100.0)
Monocytes Absolute: 1.2 10*3/uL — ABNORMAL HIGH (ref 0.1–1.0)
Monocytes Relative: 10 %
Neutro Abs: 5.2 10*3/uL (ref 1.7–7.7)
Neutrophils Relative %: 45 %
Platelets: 198 10*3/uL (ref 150–400)
RBC: 4.2 MIL/uL (ref 3.87–5.11)
RDW: 13.7 % (ref 11.5–15.5)
WBC: 11.5 10*3/uL — ABNORMAL HIGH (ref 4.0–10.5)
nRBC: 0 % (ref 0.0–0.2)

## 2021-11-15 LAB — COMPREHENSIVE METABOLIC PANEL
ALT: 13 U/L (ref 0–44)
AST: 19 U/L (ref 15–41)
Albumin: 3.6 g/dL (ref 3.5–5.0)
Alkaline Phosphatase: 62 U/L (ref 38–126)
Anion gap: 7 (ref 5–15)
BUN: 14 mg/dL (ref 6–20)
CO2: 28 mmol/L (ref 22–32)
Calcium: 8.9 mg/dL (ref 8.9–10.3)
Chloride: 105 mmol/L (ref 98–111)
Creatinine, Ser: 0.86 mg/dL (ref 0.44–1.00)
GFR, Estimated: >60 mL/min (ref >60)
Glucose, Bld: 105 mg/dL — ABNORMAL HIGH (ref 70–99)
Potassium: 4.6 mmol/L (ref 3.5–5.1)
Sodium: 140 mmol/L (ref 135–145)
Total Bilirubin: 0.7 mg/dL (ref 0.3–1.2)
Total Protein: 5.9 g/dL — ABNORMAL LOW (ref 6.5–8.1)

## 2021-11-15 LAB — RESP PANEL BY RT-PCR (FLU A&B, COVID) ARPGX2
Influenza A by PCR: NEGATIVE
Influenza B by PCR: NEGATIVE
SARS Coronavirus 2 by RT PCR: POSITIVE — AB

## 2021-11-15 MED ORDER — PREDNISONE 20 MG PO TABS: ORAL_TABLET | ORAL | 0 refills | Status: DC

## 2021-11-15 MED ORDER — AEROCHAMBER PLUS FLO-VU MISC
1.0000 | Freq: Once | Status: DC
Start: 2021-11-15 — End: 2021-11-15
  Filled 2021-11-15: qty 1

## 2021-11-15 MED ORDER — ALBUTEROL SULFATE HFA 108 (90 BASE) MCG/ACT IN AERS
4.0000 | INHALATION_SPRAY | Freq: Once | RESPIRATORY_TRACT | Status: AC
  Administered 2021-11-15: 4 via RESPIRATORY_TRACT
  Filled 2021-11-15: qty 6.7

## 2021-11-15 MED ORDER — ALBUTEROL SULFATE (2.5 MG/3ML) 0.083% IN NEBU
5.0000 mg/h | INHALATION_SOLUTION | Freq: Once | RESPIRATORY_TRACT | Status: AC
  Administered 2021-11-15: 5 mg/h via RESPIRATORY_TRACT
  Filled 2021-11-15: qty 6

## 2021-11-15 MED ORDER — ALBUTEROL SULFATE HFA 108 (90 BASE) MCG/ACT IN AERS: 2.0000 | INHALATION_SPRAY | Freq: Four times a day (QID) | RESPIRATORY_TRACT | 0 refills | Status: AC

## 2021-11-15 MED ORDER — PREDNISONE 20 MG PO TABS
60.0000 mg | ORAL_TABLET | Freq: Once | ORAL | Status: AC
  Administered 2021-11-15: 60 mg via ORAL
  Filled 2021-11-15: qty 3

## 2021-11-15 MED ORDER — NIRMATRELVIR/RITONAVIR (PAXLOVID)TABLET: 3.0000 | ORAL_TABLET | Freq: Two times a day (BID) | ORAL | 0 refills | Status: AC

## 2021-12-01 ENCOUNTER — Ambulatory Visit: Payer: Self-pay | Admitting: *Deleted

## 2021-12-01 NOTE — Telephone Encounter (Signed)
C/o shortness of breath while at work and became very anxious. Has recent dx of covid and now has laryngitis. Sent home from work due to shortness of breath and wheezing. C/o feeling short of breath only when breathing through mouth not nose. Reports wheezing . No PCP. Instructed patient to go to ED for evaluation. Care advise given to try drinking warm fluids, breathing moist air, avoid smoking or around smoke, avoid cold air and try deep breathing exercises. Patient verbalized understanding of care advise and to go to ED for evaluation.

## 2021-12-01 NOTE — Telephone Encounter (Signed)
Reason for Disposition  Wheezing can be heard across the room  Answer Assessment - Initial Assessment Questions 1. RESPIRATORY STATUS: "Describe your breathing?" (e.g., wheezing, shortness of breath, unable to speak, severe coughing)      Shortness of breath , wheezing  2. ONSET: "When did this breathing problem begin?"      Today  3. PATTERN "Does the difficult breathing come and go, or has it been constant since it started?"      Comes and goes with mobility and taking  4. SEVERITY: "How bad is your breathing?" (e.g., mild, moderate, severe)    - MILD: No SOB at rest, mild SOB with walking, speaks normally in sentences, can lie down, no retractions, pulse < 100.    - MODERATE: SOB at rest, SOB with minimal exertion and prefers to sit, cannot lie down flat, speaks in phrases, mild retractions, audible wheezing, pulse 100-120.    - SEVERE: Very SOB at rest, speaks in single words, struggling to breathe, sitting hunched forward, retractions, pulse > 120 Mild to moderate with taking and breathing in  5. RECURRENT SYMPTOM: "Have you had difficulty breathing before?" If Yes, ask: "When was the last time?" and "What happened that time?"      While having covid  6. CARDIAC HISTORY: "Do you have any history of heart disease?" (e.g., heart attack, angina, bypass surgery, angioplasty)      Cardiac arrythmia  7. LUNG HISTORY: "Do you have any history of lung disease?"  (e.g., pulmonary embolus, asthma, emphysema)     no 8. CAUSE: "What do you think is causing the breathing problem?"      Recent covid symptoms  9. OTHER SYMPTOMS: "Do you have any other symptoms? (e.g., dizziness, runny nose, cough, chest pain, fever)     Wheezing  10. O2 SATURATION MONITOR:  "Do you use an oxygen saturation monitor (pulse oximeter) at home?" If Yes, "What is your reading (oxygen level) today?" "What is your usual oxygen saturation reading?" (e.g., 95%)       na 11. PREGNANCY: "Is there any chance you are pregnant?"  "When was your last menstrual period?"       na 12. TRAVEL: "Have you traveled out of the country in the last month?" (e.g., travel history, exposures)       na  Protocols used: Breathing Difficulty-A-AH

## 2021-12-14 ENCOUNTER — Ambulatory Visit: Payer: Self-pay | Admitting: Nurse Practitioner

## 2021-12-18 ENCOUNTER — Encounter (HOSPITAL_COMMUNITY): Payer: Self-pay | Admitting: Emergency Medicine

## 2021-12-18 ENCOUNTER — Emergency Department (HOSPITAL_COMMUNITY): Payer: Self-pay

## 2021-12-18 ENCOUNTER — Emergency Department (HOSPITAL_COMMUNITY)
Admission: EM | Admit: 2021-12-18 | Discharge: 2021-12-18 | Disposition: A | Discharge status: Home / Self Care | Payer: Self-pay | Attending: Emergency Medicine | Admitting: Emergency Medicine

## 2021-12-18 DIAGNOSIS — R1010 Upper abdominal pain, unspecified: Secondary | ICD-10-CM | POA: Insufficient documentation

## 2021-12-18 DIAGNOSIS — R0781 Pleurodynia: Secondary | ICD-10-CM | POA: Insufficient documentation

## 2021-12-18 DIAGNOSIS — Z5321 Procedure and treatment not carried out due to patient leaving prior to being seen by health care provider: Secondary | ICD-10-CM | POA: Insufficient documentation

## 2021-12-18 MED ORDER — ALBUTEROL SULFATE HFA 108 (90 BASE) MCG/ACT IN AERS: 2.0000 | INHALATION_SPRAY | RESPIRATORY_TRACT | Status: DC | PRN

## 2021-12-18 NOTE — ED Triage Notes (Signed)
Patient here with complaint of upper abdominal pain after a hard plastic tote on a conveyor belt hit her upper abdomen. Patient reports pain radiating from point of impact into bilateral ribs. Patient alert, oriented, and in no apparent distress at this time. Voice is hoarse due to an existing issue that she is scheduled to see a provider for.

## 2021-12-18 NOTE — ED Notes (Signed)
Pt not responding for vital 3x

## 2022-02-08 ENCOUNTER — Other Ambulatory Visit: Payer: Self-pay

## 2022-02-08 ENCOUNTER — Encounter: Payer: Self-pay | Admitting: Nurse Practitioner

## 2022-02-08 ENCOUNTER — Ambulatory Visit: Payer: Managed Care, Other (non HMO) | Admitting: Nurse Practitioner

## 2022-02-08 VITALS — BP 132/70 | HR 75 | Temp 98.1°F | Ht 62.4 in | Wt 143.0 lb

## 2022-02-08 DIAGNOSIS — R2231 Localized swelling, mass and lump, right upper limb: Secondary | ICD-10-CM

## 2022-02-08 DIAGNOSIS — Z7689 Persons encountering health services in other specified circumstances: Secondary | ICD-10-CM

## 2022-02-08 DIAGNOSIS — E2839 Other primary ovarian failure: Secondary | ICD-10-CM | POA: Diagnosis not present

## 2022-02-08 DIAGNOSIS — Z1231 Encounter for screening mammogram for malignant neoplasm of breast: Secondary | ICD-10-CM

## 2022-02-08 DIAGNOSIS — F17200 Nicotine dependence, unspecified, uncomplicated: Secondary | ICD-10-CM

## 2022-02-08 DIAGNOSIS — Z13228 Encounter for screening for other metabolic disorders: Secondary | ICD-10-CM

## 2022-02-08 DIAGNOSIS — Z1211 Encounter for screening for malignant neoplasm of colon: Secondary | ICD-10-CM

## 2022-02-08 DIAGNOSIS — Z720 Tobacco use: Secondary | ICD-10-CM

## 2022-02-08 DIAGNOSIS — E782 Mixed hyperlipidemia: Secondary | ICD-10-CM | POA: Diagnosis not present

## 2022-02-08 DIAGNOSIS — Z1159 Encounter for screening for other viral diseases: Secondary | ICD-10-CM

## 2022-02-08 NOTE — Patient Instructions (Signed)

## 2022-02-08 NOTE — Progress Notes (Signed)
I,Tianna Badgett,acting as a Education administrator for [employer], FNP.,have documented all relevant documentation on the behalf of Minette Brine, FNP,as directed by  Minette Brine, FNP while in the presence of Minette Brine, [place].  This visit occurred during the SARS-CoV-2 public health emergency.  Safety protocols were in place, including screening questions prior to the visit, additional usage of staff PPE, and extensive cleaning of exam room while observing appropriate contact time as indicated for disinfecting solutions.  Subjective:     Patient ID: Tammy Pierce , female    DOB: Apr 05, 1966 , 56 y.o.   MRN: [id number]   Chief Complaint  Patient presents with   Establish Care    HPI  Patient is here to establish care. She was seen in the ER and being diagnosed with covid. She has not been to a PCP, the last provider was Dr. Maida Sale. 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 She is widowed - 10 years. She has 2 children - do not live local. One son and one daughter - both are healthy. She was diagnosed with bipolar 3 weeks after her husband passed. She did voluntary commitment in [place]. She was on medications. She was diagnosed with bipolar V. She did take wellbutrin when she was quitting smoking. She does continue to smoke after quitting. She is menopause. She does have her tubal ligation.   She has not had a mammogram in almost 10 years and has not had a colonoscopy.      Past Medical History:  Diagnosis Date   Bipolar affective ([place])    PTSD (post-traumatic stress disorder)      Family History  Problem Relation Age of Onset   Stroke Mother    Cancer Maternal Grandmother    Thyroid disease Maternal Grandfather      Current Outpatient Medications:    albuterol (VENTOLIN HFA) 108 (90 Base) MCG/ACT inhaler, Inhale 2 puffs into the lungs every 6 (six) hours., Disp: 1 each, Rfl: 0   esomeprazole (NEXIUM) 40 MG capsule, Take 40 mg by mouth daily at 12 noon., Disp: , Rfl:     Multiple Vitamin (MULTIVITAMIN WITH MINERALS) TABS tablet, Take 1 tablet by mouth daily., Disp: , Rfl:    Allergies  Allergen Reactions   Other Rash    [place]enna hair dye-face swelling,rash     Review of Systems  Constitutional: Negative.   Respiratory: Negative.    Cardiovascular: Negative.   Gastrointestinal: Negative.   Genitourinary:        Underneath arm has lump  Neurological: Negative.     Today's Vitals   02/08/22 1209  BP: 132/70  Pulse: 75  Temp: 98.1 F (36.7 C)  TempSrc: Oral  Weight: 143 lb (64.9 kg)  Height: 5' 2.4" (1.585 m)   Body mass index is 25.82 kg/m.  Wt Readings from Last 3 Encounters:  02/08/22 143 lb (64.9 kg)    Objective:  Physical Exam Vitals reviewed.  Constitutional:      General: She is not in acute distress.    Appearance: Normal appearance.     Comments: Voice is raspy  Cardiovascular:     Rate and Rhythm: Normal rate and regular rhythm.     Pulses: Normal pulses.     Heart sounds: Normal heart sounds. No murmur heard. Pulmonary:     Effort: Pulmonary effort is normal. No respiratory distress.     Breath sounds: Normal breath sounds. No wheezing.  Neurological:     General: No focal deficit  present.     Mental Status: She is alert and oriented to person, place, and time.     Cranial Nerves: No cranial nerve deficit.     Motor: No weakness.  Psychiatric:        Mood and Affect: Mood normal.        Behavior: Behavior normal.        Thought Content: Thought content normal.        Judgment: Judgment normal.        Assessment And Plan:     1. Mixed hyperlipidemia Comments: Had elevated cholesterol in 2015, no current medications - Lipid panel  2. Mass of right axilla Comments: Firm mass to right axilla, will check ultrasound.  - [place] AXILLA RIGHT; Future  3. Tobacco abuse Comments: Reports 25 pack year history of smoking, has tried wellbutrin in the past to quit was effective until stressed. Will check low dose CT scan -  CT CHEST LUNG CA SCREEN LOW DOSE W/O CM; Future - CBC - CMP14+EGFR  4. Smoker - CT CHEST LUNG CA SCREEN LOW DOSE W/O CM; Future - CBC - CMP14+EGFR  5. Encounter for screening mammogram for breast cancer Pt instructed on Self Breast Exam.According to ACOG guidelines Women aged 33 and older are recommended to get an annual mammogram. Form completed and given to patient contact the [facility] for appointment scheduing.  Pt encouraged to get annual mammogram - MM Digital Screening; Future  6. Encounter for hepatitis C screening test for low risk patient Will check Hepatitis C screening due to recent recommendations to screen all adults 18 years and older - Hepatitis C antibody  7. Encounter for screening for metabolic disorder - Hemoglobin A1c  8. Encounter for screening colonoscopy According to USPTF Colorectal cancer Screening guidelines. Colonoscopy is recommended every 10 years, starting at age 22 years. Will refer to GI for colon cancer screening. - Ambulatory referral to Gastroenterology  9. Decreased estrogen level - DG Bone Density; Future  10. Encounter to establish care with new doctor     Patient was given opportunity to ask questions. Patient verbalized understanding of the plan and was able to repeat key elements of the plan. All questions were answered to their satisfaction.  Minette Brine, FNP   I, Minette Brine, FNP, have reviewed all documentation for this visit. The documentation on 02/08/22 for the exam, diagnosis, procedures, and orders are all accurate and complete.   IF YOU HAVE BEEN REFERRED TO A SPECIALIST, IT MAY TAKE 1-2 WEEKS TO SCHEDULE/PROCESS THE REFERRAL. IF YOU HAVE NOT HEARD FROM US/SPECIALIST IN TWO WEEKS, PLEASE GIVE [place] A CALL AT [phone].   THE PATIENT IS ENCOURAGED TO PRACTICE SOCIAL DISTANCING DUE TO THE COVID-19 PANDEMIC.

## 2022-02-09 LAB — CBC
Hematocrit: 42.5 % (ref 34.0–46.6)
Hemoglobin: 14.3 g/dL (ref 11.1–15.9)
MCH: 31.5 pg (ref 26.6–33.0)
MCHC: 33.6 g/dL (ref 31.5–35.7)
MCV: 94 fL (ref 79–97)
Platelets: 174 10*3/uL (ref 150–450)
RBC: 4.54 x10E6/uL (ref 3.77–5.28)
RDW: 12.8 % (ref 11.7–15.4)
WBC: 7.4 10*3/uL (ref 3.4–10.8)

## 2022-02-09 LAB — CMP14+EGFR
ALT: 13 IU/L (ref 0–32)
AST: 20 IU/L (ref 0–40)
Albumin/Globulin Ratio: 2.7 — ABNORMAL HIGH (ref 1.2–2.2)
Albumin: 4.6 g/dL (ref 3.8–4.9)
Alkaline Phosphatase: 75 IU/L (ref 44–121)
BUN/Creatinine Ratio: 19 (ref 9–23)
BUN: 14 mg/dL (ref 6–24)
Bilirubin Total: <0.2 mg/dL (ref 0.0–1.2)
CO2: 24 mmol/L (ref 20–29)
Calcium: 9.5 mg/dL (ref 8.7–10.2)
Chloride: 102 mmol/L (ref 96–106)
Creatinine, Ser: 0.73 mg/dL (ref 0.57–1.00)
Globulin, Total: 1.7 g/dL (ref 1.5–4.5)
Glucose: 87 mg/dL (ref 70–99)
Potassium: 4.8 mmol/L (ref 3.5–5.2)
Sodium: 141 mmol/L (ref 134–144)
Total Protein: 6.3 g/dL (ref 6.0–8.5)
eGFR: 96 mL/min/{1.73_m2} (ref >59)

## 2022-02-09 LAB — HEMOGLOBIN A1C
Est. average glucose Bld gHb Est-mCnc: 117 mg/dL
Hgb A1c MFr Bld: 5.7 % — ABNORMAL HIGH (ref 4.8–5.6)

## 2022-02-09 LAB — HEPATITIS C ANTIBODY: Hep C Virus Ab: <0.1 s/co ratio (ref 0.0–0.9)

## 2022-02-14 NOTE — Addendum Note (Signed)
Addended by: Arnette Felts F on: 02/14/2022 04:33 PM   Modules accepted: Orders

## 2022-03-07 ENCOUNTER — Other Ambulatory Visit: Payer: Self-pay

## 2022-03-07 ENCOUNTER — Ambulatory Visit
Admission: RE | Admit: 2022-03-07 | Discharge: 2022-03-07 | Disposition: A | Discharge status: Home / Self Care | Payer: Self-pay | Source: Ambulatory Visit | Attending: Nurse Practitioner | Admitting: Nurse Practitioner

## 2022-03-07 DIAGNOSIS — Z720 Tobacco use: Secondary | ICD-10-CM

## 2022-03-07 DIAGNOSIS — F17200 Nicotine dependence, unspecified, uncomplicated: Secondary | ICD-10-CM

## 2022-03-09 DIAGNOSIS — I700 Atherosclerosis of aorta: Secondary | ICD-10-CM

## 2022-03-09 HISTORY — DX: Atherosclerosis of aorta: I70.0

## 2022-04-23 ENCOUNTER — Other Ambulatory Visit: Payer: Self-pay | Admitting: Nurse Practitioner

## 2022-04-23 DIAGNOSIS — R2231 Localized swelling, mass and lump, right upper limb: Secondary | ICD-10-CM

## 2022-05-04 ENCOUNTER — Ambulatory Visit
Admission: RE | Admit: 2022-05-04 | Discharge: 2022-05-04 | Disposition: A | Discharge status: Home / Self Care | Payer: Managed Care, Other (non HMO) | Source: Ambulatory Visit | Attending: Nurse Practitioner | Admitting: Nurse Practitioner

## 2022-05-04 ENCOUNTER — Other Ambulatory Visit: Payer: Self-pay | Admitting: Nurse Practitioner

## 2022-05-04 DIAGNOSIS — R2231 Localized swelling, mass and lump, right upper limb: Secondary | ICD-10-CM

## 2022-05-10 ENCOUNTER — Ambulatory Visit (HOSPITAL_COMMUNITY)
Admission: EM | Admit: 2022-05-10 | Discharge: 2022-05-10 | Disposition: A | Discharge status: Home / Self Care | Payer: Managed Care, Other (non HMO) | Attending: Internal Medicine | Admitting: Internal Medicine

## 2022-05-10 ENCOUNTER — Encounter (HOSPITAL_COMMUNITY): Payer: Self-pay | Admitting: Emergency Medicine

## 2022-05-10 DIAGNOSIS — M549 Dorsalgia, unspecified: Secondary | ICD-10-CM | POA: Diagnosis not present

## 2022-05-10 DIAGNOSIS — T148XXA Other injury of unspecified body region, initial encounter: Secondary | ICD-10-CM | POA: Diagnosis not present

## 2022-05-10 MED ORDER — CYCLOBENZAPRINE HCL 5 MG PO TABS
5.0000 mg | ORAL_TABLET | Freq: Two times a day (BID) | ORAL | 0 refills | Status: AC | PRN
Start: 2022-05-10 — End: ?

## 2022-05-10 NOTE — ED Triage Notes (Signed)
Pt is present today with lower back pain that radiates up to her neck. Pt states that she may have pulled a muscle at work. Pt states that the pain started Tuesday  ?

## 2022-05-10 NOTE — Discharge Instructions (Signed)
You have been prescribed a muscle relaxer to alleviate your back pain.  Please be advised that it can cause drowsiness.  Also continue to alternate ice and heat to affected area.  Unable to try prednisone as it may lower your seizure threshold.  I also recommend that you follow-up with your primary care doctor if you been having seizures.  Please follow-up if symptoms persist or worsen. ?

## 2022-05-10 NOTE — ED Provider Notes (Signed)
?[facility] ? ? ? ?CSN: [id number] ?Arrival date & time: 05/10/22  4128 ? ? ?  ? ?History   ?Chief Complaint ?Chief Complaint  ?Patient presents with  ? Back Pain  ? ? ?HPI ?Tammy Pierce is a 56 y.o. female.  ? ?Patient presents with back pain that started approximately 2 to 3 days ago.  Patient denies any apparent injury but thinks she may have pulled it at work.  It is located throughout entirety of back and radiates up neck at times.  Patient does report that she has chronic back pain due to scoliosis but this feels like a muscle spasm.  Patient has taken ibuprofen and used heat application with minimal improvement.  Pain does not radiate down legs.  Denies urinary frequency, urinary or bowel incontinence, saddle anesthesia.  Movement exacerbates pain. ? ? ?Back Pain ? ?Past Medical History:  ?Diagnosis Date  ? Bipolar affective (HCC)   ? PTSD (post-traumatic stress disorder)   ? ? ?Patient Active Problem List  ? Diagnosis Date Noted  ? Tobacco abuse 02/08/2022  ? Hypercholesteremia 07/08/2013  ? 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 ? ?Past Surgical History:  ?Procedure Laterality Date  ? TUBAL LIGATION    ? ? ?OB History   ?No obstetric history on file. ?  ? ? ? ?Home Medications   ? ?Prior to Admission medications   ?Medication Sig Start Date End Date Taking? Authorizing Provider  ?cyclobenzaprine (FLEXERIL) 5 MG tablet Take 1 tablet (5 mg total) by mouth 2 (two) times daily as needed for muscle spasms. 05/10/22  Yes Marlana Mckowen, Acie Fredrickson, FNP  ?albuterol (VENTOLIN HFA) 108 (90 Base) MCG/ACT inhaler Inhale 2 puffs into the lungs every 6 (six) hours. 11/15/21   Mesner, Barbara Cower, MD  ?esomeprazole (NEXIUM) 40 MG capsule Take 40 mg by mouth daily at 12 noon.    [provider]  ?Multiple Vitamin (MULTIVITAMIN WITH MINERALS) TABS tablet Take 1 tablet by mouth daily.    [provider]  ? ? ?Family History ?Family History  ?Problem Relation Age of Onset  ? Stroke Mother   ?  Cancer Maternal Grandmother   ? Thyroid disease Maternal Grandfather   ? ? ?Social History ?Social History  ? ?Tobacco Use  ? Smoking status: Every Day  ?  Packs/day: 0.50  ?  Years: 50.00  ?  Pack years: 25.00  ?  Types: Cigarettes  ?Substance Use Topics  ? Alcohol use: Yes  ? Drug use: No  ?  Comment: delta 8  ? ? ? ?Allergies   ?Other ? ? ?Review of Systems ?Review of Systems ?Per HPI ? ?Physical Exam ?Triage Vital Signs ?ED Triage Vitals  ?Enc Vitals Group  ?   BP 05/10/22 0933 (!) 152/74  ?   Pulse Rate 05/10/22 0933 78  ?   Resp 05/10/22 0933 18  ?   Temp 05/10/22 0933 98.5 ?F (36.9 ?C)  ?   Temp src --   ?   SpO2 05/10/22 0933 96 %  ?   Weight --   ?   Height --   ?   Head Circumference --   ?   Peak Flow --   ?   Pain Score 05/10/22 0935 6  ?   Pain Loc --   ?   Pain Edu? --   ?   Excl. in GC? --   ? ?No data found. ? ?Updated Vital Signs ?BP (!) 152/74   Pulse 78  Temp 98.5 ?F (36.9 ?C)   Resp 18   SpO2 96%  ? ?Visual Acuity ?Right Eye Distance:   ?Left Eye Distance:   ?Bilateral Distance:   ? ?Right Eye Near:   ?Left Eye Near:    ?Bilateral Near:    ? ?Physical Exam ?Constitutional:   ?   General: She is not in acute distress. ?   Appearance: Normal appearance. She is not toxic-appearing or diaphoretic.  ?HENT:  ?   Head: Normocephalic and atraumatic.  ?Eyes:  ?   Extraocular Movements: Extraocular movements intact.  ?   Conjunctiva/sclera: Conjunctivae normal.  ?Pulmonary:  ?   Effort: Pulmonary effort is normal.  ?Musculoskeletal:  ?   Cervical back: Normal.  ?   Thoracic back: Tenderness present. No swelling, edema or bony tenderness.  ?   Lumbar back: Tenderness present. No swelling, edema or bony tenderness. Negative right straight leg raise test and negative left straight leg raise test.  ?     Back: ? ?   Comments: Tenderness to palpation to circled areas on diagram.  No direct spinal tenderness, crepitus, step-off.  No obvious discoloration or swelling noted.  ?Neurological:  ?   General: No  focal deficit present.  ?   Mental Status: She is alert and oriented to person, place, and time. Mental status is at baseline.  ?   Deep Tendon Reflexes: Reflexes are normal and symmetric.  ?Psychiatric:     ?   Mood and Affect: Mood normal.     ?   Behavior: Behavior normal.     ?   Thought Content: Thought content normal.     ?   Judgment: Judgment normal.  ? ? ? ?UC Treatments / Results  ?Labs ?(all labs ordered are listed, but only abnormal results are displayed) ?Labs Reviewed - No data to display ? ?EKG ? ? ?Radiology ?No results found. ? ?Procedures ?Procedures (including critical care time) ? ?Medications Ordered in UC ?Medications - No data to display ? ?Initial Impression / Assessment and Plan / UC Course  ?I have reviewed the triage vital signs and the nursing notes. ? ?Pertinent labs & imaging results that were available during my care of the patient were reviewed by me and considered in my medical decision making (see chart for details). ? ?  ?Physical exam is most consistent with muscular strain/injury.  Will prescribe muscle relaxer but the patient was advised that this can cause drowsiness and do not drive while taking this medication.  Will avoid prednisone at this time as patient reports history of seizures with last known seizure being approximately 1 month ago.  Discussed supportive care and alternating ice and heat application.  Do not think that imaging is necessary given no apparent injury or direct spinal tenderness.  Patient advised to follow-up if symptoms persist or worsen.  Patient was also advised to follow-up with PCP given recent seizure for further evaluation and management of this.  Patient verbalized understanding and was agreeable with plan. ?Final Clinical Impressions(s) / UC Diagnoses  ? ?Final diagnoses:  ?Muscle strain  ?Other acute back pain  ? ? ? ?Discharge Instructions   ? ?  ?You have been prescribed a muscle relaxer to alleviate your back pain.  Please be advised that it  can cause drowsiness.  Also continue to alternate ice and heat to affected area.  Unable to try prednisone as it may lower your seizure threshold.  I also recommend that you follow-up with your primary  care doctor if you been having seizures.  Please follow-up if symptoms persist or worsen. ? ? ? ?ED Prescriptions   ? ? Medication Sig Dispense Auth. Provider  ? cyclobenzaprine (FLEXERIL) 5 MG tablet Take 1 tablet (5 mg total) by mouth 2 (two) times daily as needed for muscle spasms. 20 tablet Teodora Medici, [place]  ? ?  ? ?I have reviewed the PDMP during this encounter. ?  ?Teodora Medici, Reader ?05/10/22 [id number] ? ?

## 2022-06-28 IMAGING — US US AXILLARY RIGHT
1 series · 4 of 4 positions shown · non-contrast
Comparison: Previous exam(s).

CLINICAL DATA: Patient complains of a palpable abnormality in her
right axilla.

EXAM:
DIGITAL DIAGNOSTIC BILATERAL MAMMOGRAM WITH TOMOSYNTHESIS AND CAD;
US AXILLARY RIGHT
TECHNIQUE: Bilateral digital diagnostic mammography and breast tomosynthesis
was performed. The images were evaluated with computer-aided
detection.; Targeted ultrasound examination of the right axilla was
performed.

[Series 1: us axillary right · 0.07mm/px · 4 of 4 slices shown]
[im 1/4]
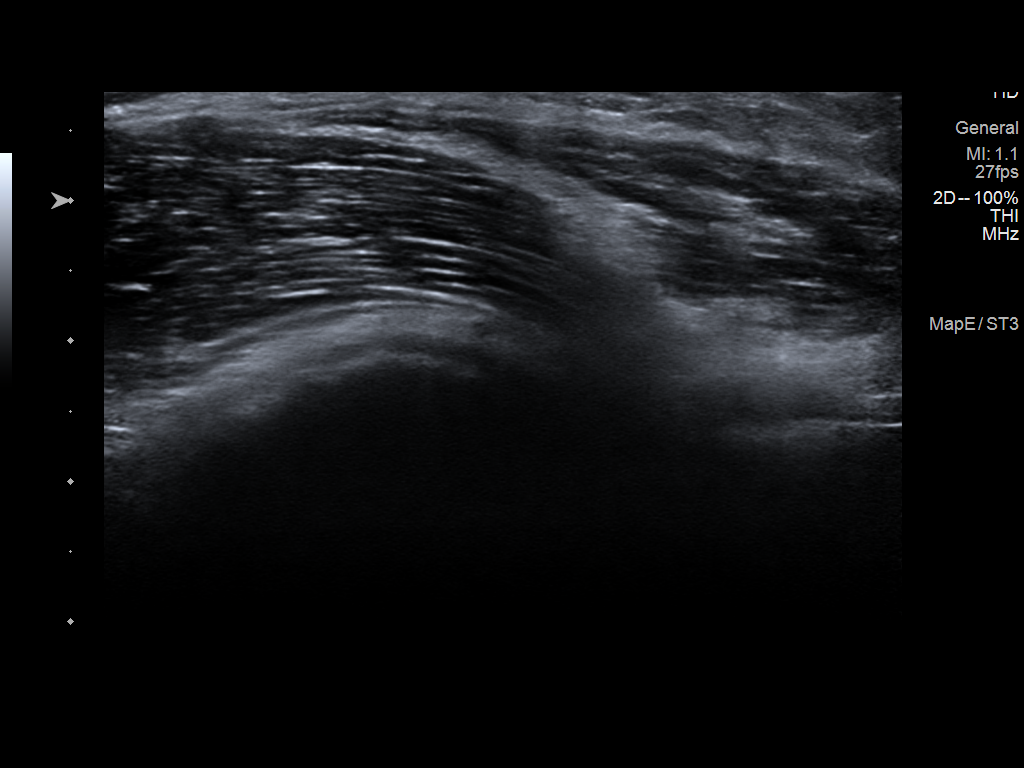
[im 2/4]
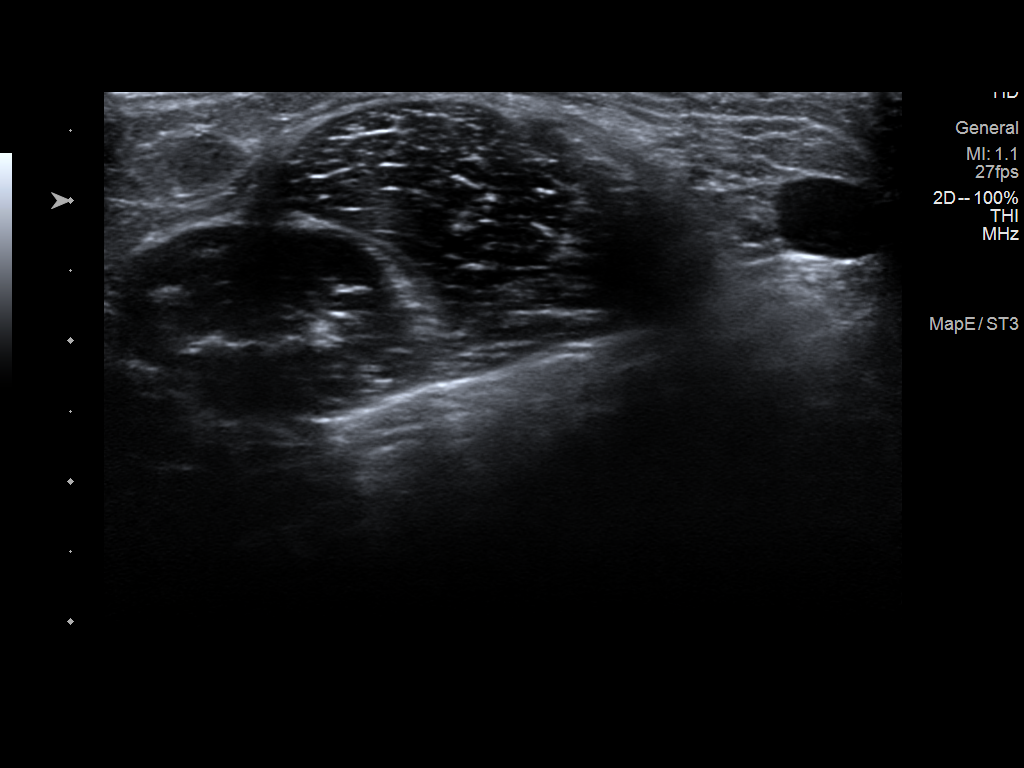
[im 3/4]
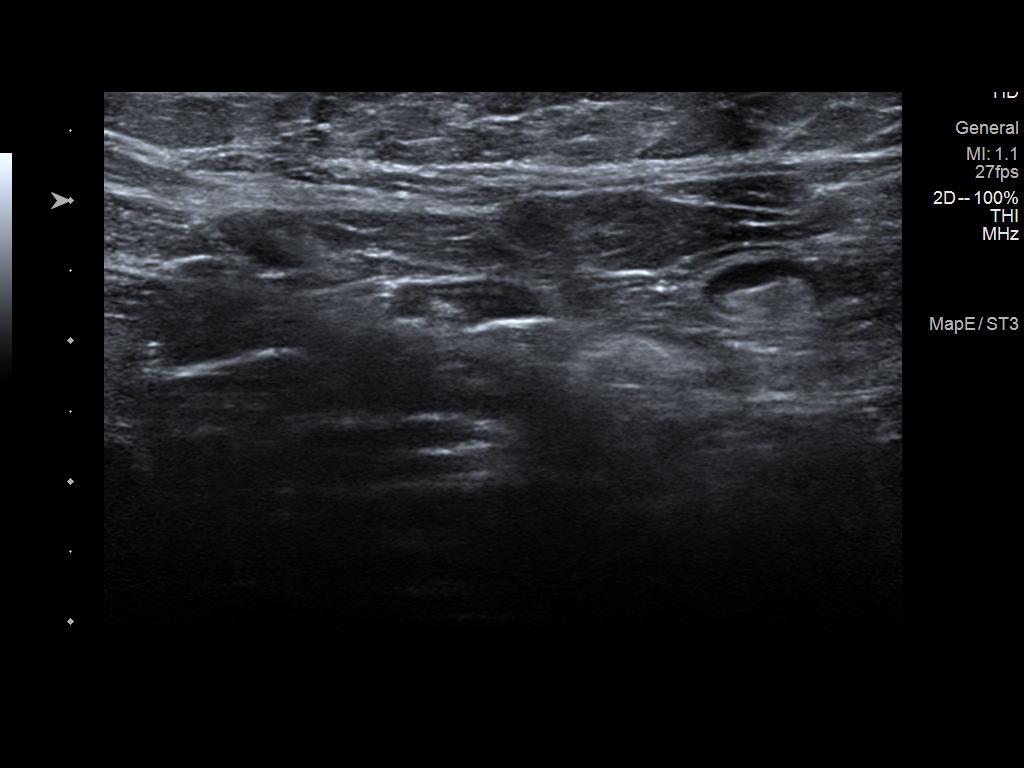
[im 4/4]
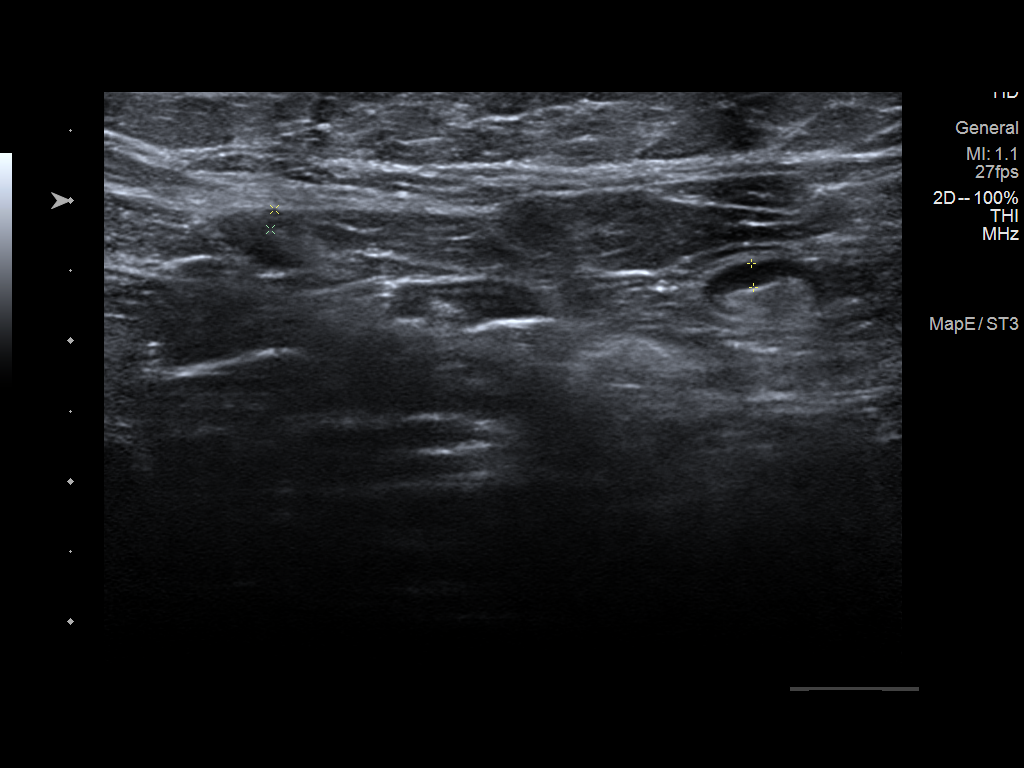

[4 of 4 positions shown; findings below may reference images not displayed]

ACR Breast Density Category c: The breast tissue is heterogeneously
dense, which may obscure small masses.
FINDINGS: No suspicious mass, malignant type microcalcifications or distortion
detected in either breast.

On physical exam, I do not palpate a discrete mass in the right
axilla.

Targeted ultrasound is performed, showing normal tissue in the right
axilla. The palpable abnormality corresponds with a normal appearing
muscle. There is no enlarged adenopathy.
IMPRESSION: No evidence of malignancy in either breast.

RECOMMENDATION:
Bilateral screening mammogram in 1 year is recommended.

I have discussed the findings and recommendations with the patient.
If applicable, a reminder letter will be sent to the patient
regarding the next appointment.

BI-RADS CATEGORY  1: Negative.

## 2022-07-02 ENCOUNTER — Encounter: Payer: Managed Care, Other (non HMO) | Admitting: Nurse Practitioner

## 2022-09-22 ENCOUNTER — Encounter: Payer: Self-pay | Admitting: Nurse Practitioner

## 2023-04-30 ENCOUNTER — Other Ambulatory Visit: Payer: Self-pay | Admitting: Pain Medicine

## 2023-04-30 DIAGNOSIS — Z1231 Encounter for screening mammogram for malignant neoplasm of breast: Secondary | ICD-10-CM

## 2023-05-10 ENCOUNTER — Encounter: Payer: Self-pay | Admitting: Family Medicine

## 2023-06-04 ENCOUNTER — Ambulatory Visit
Admission: RE | Admit: 2023-06-04 | Discharge: 2023-06-04 | Disposition: A | Discharge status: Home / Self Care | Payer: Medicaid Other | Source: Ambulatory Visit | Attending: Pain Medicine | Admitting: Pain Medicine

## 2023-06-04 DIAGNOSIS — Z1231 Encounter for screening mammogram for malignant neoplasm of breast: Secondary | ICD-10-CM

## 2023-06-05 ENCOUNTER — Other Ambulatory Visit: Payer: Self-pay | Admitting: Pain Medicine

## 2023-06-05 DIAGNOSIS — R928 Other abnormal and inconclusive findings on diagnostic imaging of breast: Secondary | ICD-10-CM

## 2023-06-07 ENCOUNTER — Ambulatory Visit
Admission: RE | Admit: 2023-06-07 | Discharge: 2023-06-07 | Disposition: A | Discharge status: Home / Self Care | Payer: Medicaid Other | Source: Ambulatory Visit | Attending: Pain Medicine | Admitting: Pain Medicine

## 2023-06-07 DIAGNOSIS — R928 Other abnormal and inconclusive findings on diagnostic imaging of breast: Secondary | ICD-10-CM

## 2025-01-19 ENCOUNTER — Encounter: Payer: Self-pay | Admitting: Gastroenterology

## 2025-02-12 ENCOUNTER — Ambulatory Visit: Admitting: Gastroenterology

## 2025-03-04 ENCOUNTER — Ambulatory Visit: Admitting: Gastroenterology
